# Patient Record
Sex: Female | Born: 1990 | Race: White | Hispanic: No | Marital: Single | Smoking: Never smoker
Health system: Southern US, Community
[De-identification: ages and names within clinical notes are randomized; demographics above are authoritative.]

## PROBLEM LIST (undated history)

## (undated) HISTORY — PX: ECTOPIC PREGNANCY SURGERY: SHX613

## (undated) HISTORY — PX: VAGINAL SEPTUM RESECTION: SHX2644

---
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
## 2012-05-28 ENCOUNTER — Ambulatory Visit: Payer: Self-pay | Admitting: Obstetrics and Gynecology

## 2013-09-02 IMAGING — CR RIGHT ANKLE - COMPLETE 3+ VIEW
1 series · 5 of 5 positions shown · non-contrast
Comparison: none

REASON FOR EXAM: right lateral pain
COMMENTS:

[Series 1: ap · 0.17mm/px · 5 of 5 slices shown]
[im 1/5]
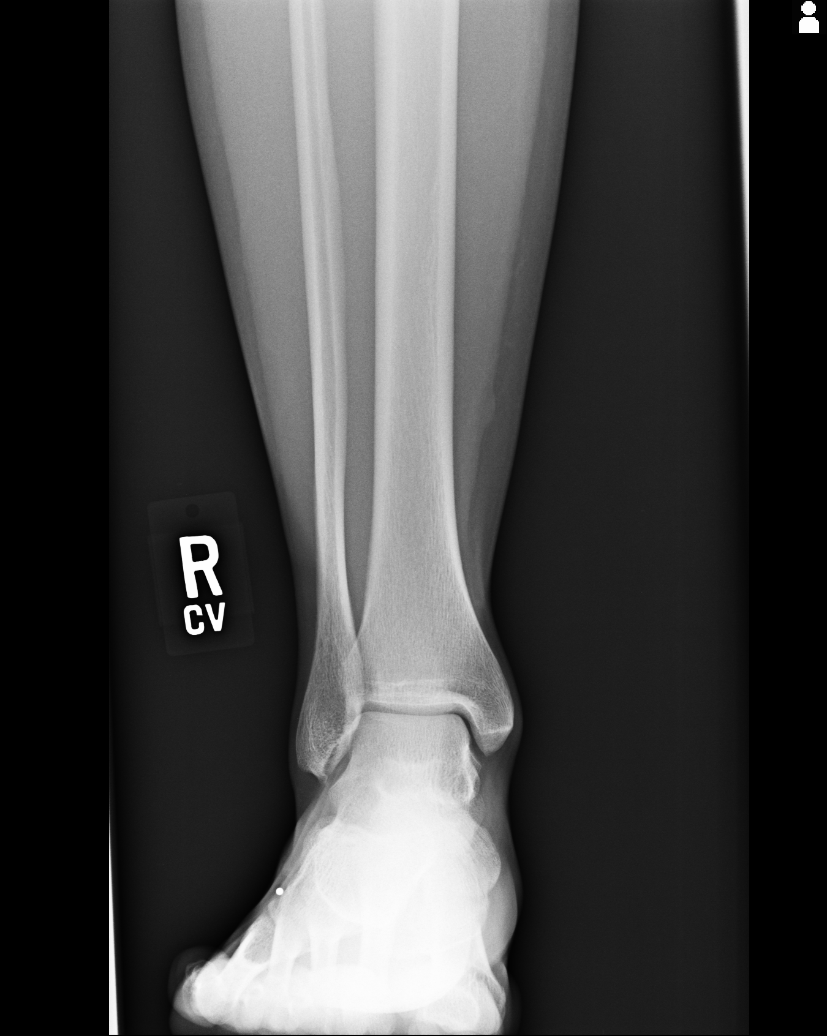
[im 2/5]
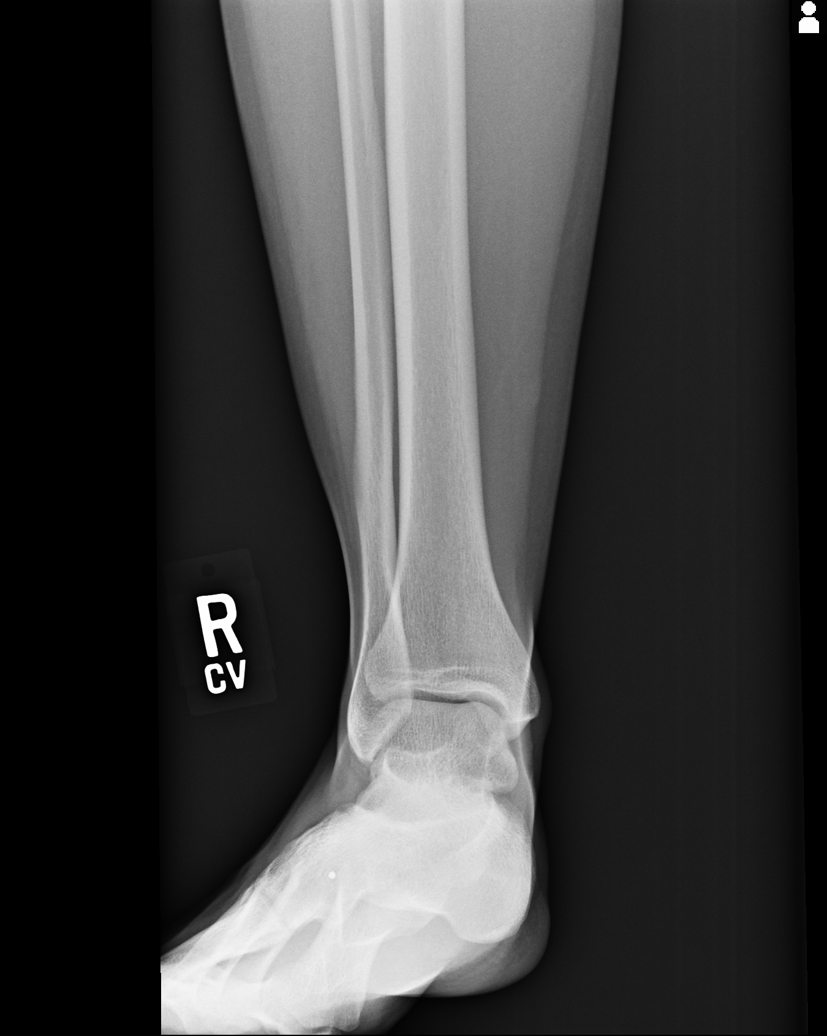
[im 3/5]
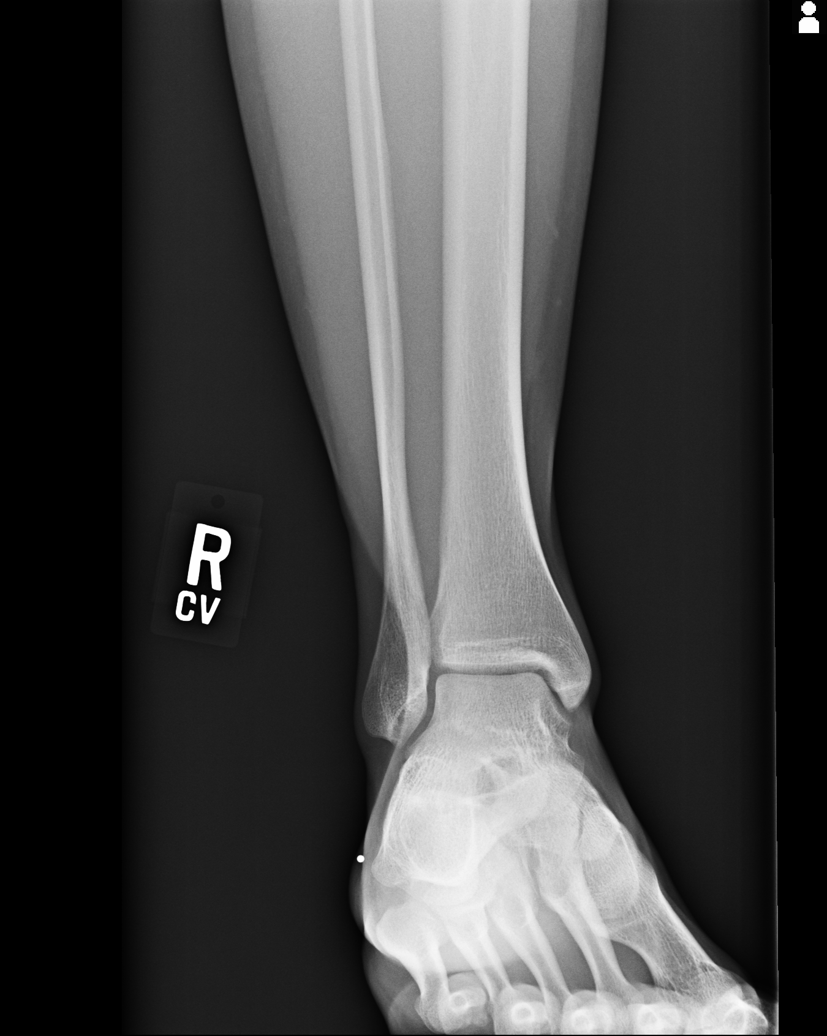
[im 4/5]
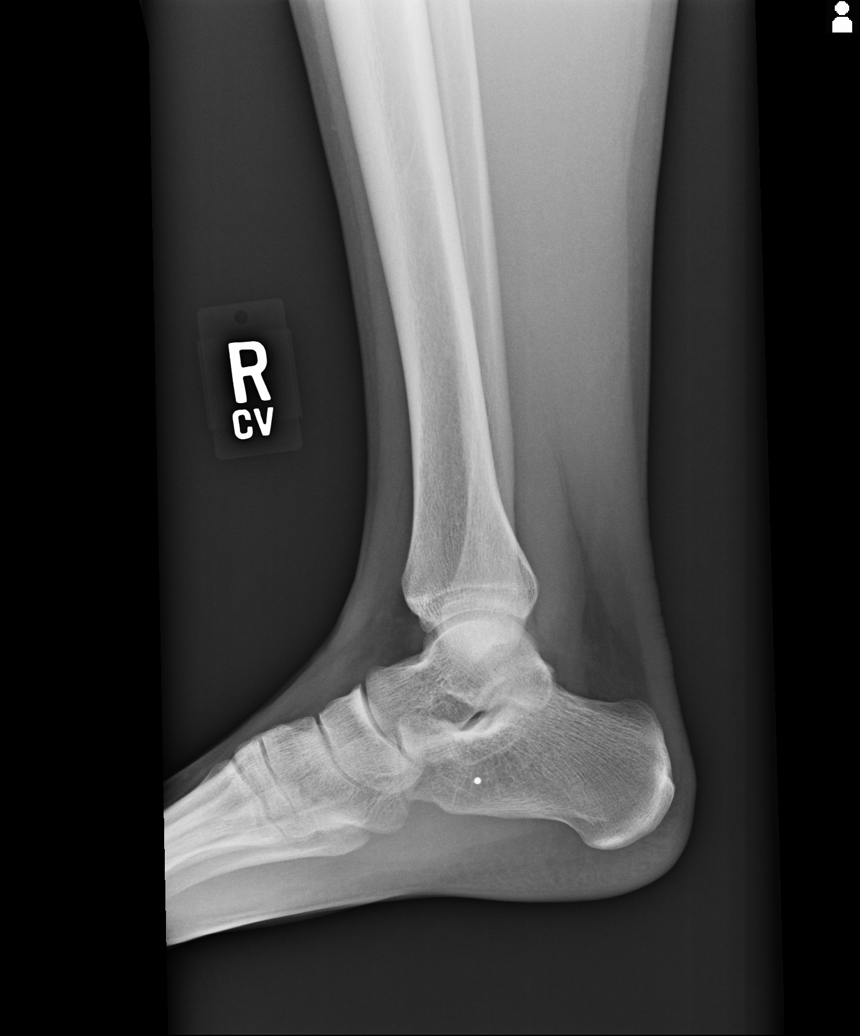
[im 5/5]
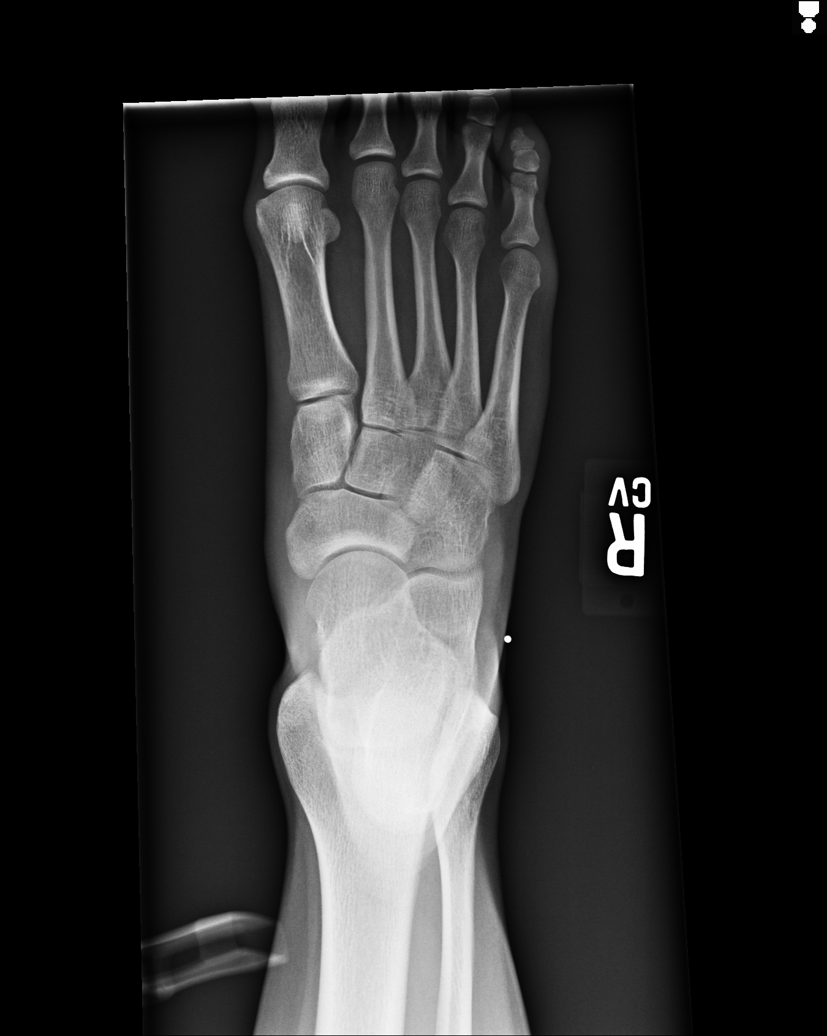

[5 of 5 positions shown; findings below may reference images not displayed]

PROCEDURE:     KDR - KDXR ANKLE RIGHT COMPLETE  - May 28, 2012  [DATE]

RESULT:     Right ankle images demonstrate no definite soft tissue swelling,
fracture, dislocation or foreign body. A rounded metallic marker is placed
over the lateral aspect over the mid anterior calcaneus. The trabecular
pattern appears normal. If there is concern for occult fracture repeat
images in 7 to 10 days may be beneficial. If there is concern for soft
tissue injury within followup MRI may be necessary.
IMPRESSION: No acute bony abnormality evident.

[REDACTED]

## 2013-09-17 DIAGNOSIS — Q5210 Doubling of vagina, unspecified: Secondary | ICD-10-CM | POA: Insufficient documentation

## 2014-07-10 DIAGNOSIS — O00109 Unspecified tubal pregnancy without intrauterine pregnancy: Secondary | ICD-10-CM | POA: Insufficient documentation

## 2014-10-01 ENCOUNTER — Other Ambulatory Visit: Payer: Self-pay

## 2014-10-01 ENCOUNTER — Encounter: Payer: Self-pay | Admitting: Obstetrics and Gynecology

## 2014-10-01 ENCOUNTER — Ambulatory Visit (INDEPENDENT_AMBULATORY_CARE_PROVIDER_SITE_OTHER): Payer: BLUE CROSS/BLUE SHIELD | Admitting: Obstetrics and Gynecology

## 2014-10-01 VITALS — BP 123/78 | HR 61 | Ht 64.0 in | Wt 124.2 lb

## 2014-10-01 DIAGNOSIS — Z309 Encounter for contraceptive management, unspecified: Secondary | ICD-10-CM

## 2014-10-01 DIAGNOSIS — Z30431 Encounter for routine checking of intrauterine contraceptive device: Secondary | ICD-10-CM | POA: Diagnosis not present

## 2014-10-01 DIAGNOSIS — IMO0001 Reserved for inherently not codable concepts without codable children: Secondary | ICD-10-CM

## 2014-10-01 DIAGNOSIS — R102 Pelvic and perineal pain unspecified side: Secondary | ICD-10-CM

## 2014-10-01 DIAGNOSIS — T8332XA Displacement of intrauterine contraceptive device, initial encounter: Secondary | ICD-10-CM

## 2014-10-01 DIAGNOSIS — Z3009 Encounter for other general counseling and advice on contraception: Secondary | ICD-10-CM

## 2014-10-01 DIAGNOSIS — N920 Excessive and frequent menstruation with regular cycle: Secondary | ICD-10-CM | POA: Diagnosis not present

## 2014-10-01 MED ORDER — NORETHIN-ETH ESTRAD-FE BIPHAS 1 MG-10 MCG / 10 MCG PO TABS: 1.0000 | ORAL_TABLET | Freq: Every day | ORAL | Status: DC

## 2014-10-06 NOTE — Progress Notes (Signed)
GYNECOLOGY PROGRESS NOTE  Subjective:    Patient ID: Joann Acevedo, female    DOB: 1990/03/28, 24 y.o.   MRN: [id number]  HPI  Patient is a 24 y.o. [id number] female who presents for follow-up after Paraguard IUD placement and further discussion of contraception.  Had IUD placed yesterday at office in [facility], however is concerned regarding proper placement and location of IUD as she recently experienced an ectopic with a Mirena IUD (had in place x 2 years). Does note some mild cramping but otherwise denies complaints. Has also recently initiated taking a low dose OCP to help with management of menstrual cycles.    History reviewed. No pertinent past medical history.  Past Surgical History  Procedure Laterality Date  . Ectopic pregnancy surgery    . Vaginal septum resection     Outpatient Encounter Prescriptions as of 10/01/2014  Medication Sig  .  Norethin Ace-Eth Estrad-FE (MINASTRIN 24 FE) 1-20 MG-MCG(24) CHEW Chew by mouth.   No Known Allergies   All other history reviewed, including social, family history.   Review of Systems Pertinent items are noted in HPI.   Objective:   Blood pressure 123/78, pulse 61, height  (1.626 m), weight 124 lb 3.2 oz (56.337 kg). General appearance: alert and no distress Abdomen: soft, non-tender; bowel sounds normal; no masses,  no organomegaly Pelvic: external genitalia normal, vagina normal without discharge and cervix visualized with strings noted 2 cm from external os. Cervix nulliparous, nontender, no lesions. Uterus normal in shape and sized, mobile, nontender.  Extremities: extremities normal, atraumatic, no cyanosis or edema Neurologic: Grossly normal   Imaging: 10/01/2014 Pelvic Ultrasound  Findings:  The uterus measures 7.2 x 3.1 x 4.8.  Echo texture is homogenous without evidence of focal masses.  The Endometrium measures 2.4 mm.  The IUD appears to be lower within the LUS of the uterus and not within the fundus  Right  Ovary measures 4.0 x 2.1 x 2.5 cm. It is normal in appearance. Left Ovary measures 2.8 x 2.3 x 2.5 cm. It is normal appearance. Survey of the adnexa demonstrates no adnexal masses. There is a trace amount of free fluid in the cul de sac.  Assessment:   1) Malpositioned IUD 2) Contraceptive counseling 3) Heavy menses  Plan:   Discussion had on ultrasound findings with malpositioned IUD.  Informed patient that IUD is currently ineffective, and should be removed.  Discussed possibility of placement with another IUD, vs removal and use of alternative method of contraception.  Reviewed all forms of birth control options available including abstinence; over the counter/barrier methods; hormonal contraceptive medication including pill, patch, ring, injection,contraceptive implant; hormonal and nonhormonal IUDs; permanent sterilization options including vasectomy and the various tubal sterilization modalities. Risks and benefits reviewed.  Questions were answered. Patient desires IUD removal and will continue with OCPs.  Will give samples of Lo-Loestrin and send in prescription. Discussed importance of taking pills at around same time daily. Noted that OCPs would also help with h/o heavy menses.  IUD removed today.     IUD Removal Procedure Note Patient identified, informed consent obtained verbally. Patient was in the dorsal lithotomy position, normal external genitalia was noted.  A speculum was placed in the patient's vagina, normal scant discharge was noted, no lesions. The cervix was visualized, no lesions, no abnormal discharge.  The strings of the IUD were grasped and pulled using ring forceps. The IUD was removed in its entirety. Patient tolerated the procedure well.  Patient will use OCPs for contraception. Routine preventative health maintenance measures emphasized.    Hildred Laser, MD [facility]

## 2016-04-21 ENCOUNTER — Other Ambulatory Visit: Payer: Self-pay

## 2016-04-21 DIAGNOSIS — Z3041 Encounter for surveillance of contraceptive pills: Secondary | ICD-10-CM

## 2016-04-21 MED ORDER — NORETHIN-ETH ESTRAD-FE BIPHAS 1 MG-10 MCG / 10 MCG PO TABS: 1.0000 | ORAL_TABLET | Freq: Every day | ORAL | 11 refills | Status: AC
